# Patient Record
Sex: Male | Born: 1955 | Race: White | Marital: Single | Smoking: Never smoker
Health system: Southern US, Community
[De-identification: ages and names within clinical notes are randomized; demographics above are authoritative.]

---

## 2013-10-30 ENCOUNTER — Emergency Department (HOSPITAL_COMMUNITY)
Admission: EM | Admit: 2013-10-30 | Discharge: 2013-10-30 | Disposition: A | Discharge status: Home / Self Care | Payer: 59 | Attending: Emergency Medicine | Admitting: Emergency Medicine

## 2013-10-30 ENCOUNTER — Emergency Department (HOSPITAL_COMMUNITY): Payer: 59

## 2013-10-30 ENCOUNTER — Encounter (HOSPITAL_COMMUNITY): Payer: Self-pay | Admitting: Emergency Medicine

## 2013-10-30 DIAGNOSIS — J069 Acute upper respiratory infection, unspecified: Secondary | ICD-10-CM | POA: Insufficient documentation

## 2013-10-30 DIAGNOSIS — F411 Generalized anxiety disorder: Secondary | ICD-10-CM | POA: Insufficient documentation

## 2013-10-30 DIAGNOSIS — Z79899 Other long term (current) drug therapy: Secondary | ICD-10-CM | POA: Insufficient documentation

## 2013-10-30 DIAGNOSIS — F419 Anxiety disorder, unspecified: Secondary | ICD-10-CM

## 2013-10-30 LAB — BASIC METABOLIC PANEL
CO2: 20 mEq/L (ref 19–32)
GFR calc Af Amer: >90 mL/min (ref >90)
Glucose, Bld: 107 mg/dL — ABNORMAL HIGH (ref 70–99)
Potassium: 3.9 mEq/L (ref 3.7–5.3)
Sodium: 141 mEq/L (ref 137–147)

## 2013-10-30 LAB — I-STAT TROPONIN, ED: Troponin i, poc: 0 ng/mL (ref 0.00–0.08)

## 2013-10-30 LAB — PRO B NATRIURETIC PEPTIDE: Pro B Natriuretic peptide (BNP): 59.4 pg/mL (ref 0–125)

## 2013-10-30 LAB — BASIC METABOLIC PANEL WITH GFR
BUN: 16 mg/dL (ref 6–23)
Calcium: 9.3 mg/dL (ref 8.4–10.5)
Chloride: 105 meq/L (ref 96–112)
Creatinine, Ser: 0.99 mg/dL (ref 0.50–1.35)
GFR calc non Af Amer: >90 mL/min (ref >90)

## 2013-10-30 LAB — CBC
HCT: 43.7 % (ref 39.0–52.0)
Hemoglobin: 15.6 g/dL (ref 13.0–17.0)
MCH: 30.5 pg (ref 26.0–34.0)
MCHC: 35.7 g/dL (ref 30.0–36.0)
MCV: 85.4 fL (ref 78.0–100.0)
Platelets: 157 10*3/uL (ref 150–400)
RBC: 5.12 MIL/uL (ref 4.22–5.81)
RDW: 12.7 % (ref 11.5–15.5)
WBC: 9.6 K/uL (ref 4.0–10.5)

## 2013-10-30 LAB — CBG MONITORING, ED: Glucose-Capillary: 95 mg/dL (ref 70–99)

## 2013-10-30 MED ORDER — AZITHROMYCIN 250 MG PO TABS: 250.0000 mg | ORAL_TABLET | Freq: Every day | ORAL | Status: AC

## 2013-10-30 MED ORDER — ALBUTEROL SULFATE HFA 108 (90 BASE) MCG/ACT IN AERS
2.0000 | INHALATION_SPRAY | RESPIRATORY_TRACT | Status: DC | PRN
Start: 2013-10-30 — End: 2013-10-31

## 2013-10-30 MED ORDER — LORAZEPAM 2 MG/ML IJ SOLN
0.5000 mg | Freq: Once | INTRAMUSCULAR | Status: DC
  Filled 2013-10-30: qty 1

## 2013-10-30 MED ORDER — IPRATROPIUM BROMIDE 0.02 % IN SOLN
0.5000 mg | Freq: Once | RESPIRATORY_TRACT | Status: AC
  Administered 2013-10-30: 0.5 mg via RESPIRATORY_TRACT
  Filled 2013-10-30: qty 2.5

## 2013-10-30 MED ORDER — FENTANYL CITRATE 0.05 MG/ML IJ SOLN: 50.0000 ug | Freq: Once | INTRAMUSCULAR | Status: DC

## 2013-10-30 MED ORDER — LORAZEPAM 1 MG PO TABS: 0.5000 mg | ORAL_TABLET | Freq: Three times a day (TID) | ORAL | Status: AC | PRN

## 2013-10-30 MED ORDER — BENZONATATE 100 MG PO CAPS: 100.0000 mg | ORAL_CAPSULE | Freq: Three times a day (TID) | ORAL | Status: AC

## 2013-10-30 MED ORDER — LORAZEPAM 2 MG/ML IJ SOLN: 1.0000 mg | Freq: Once | INTRAMUSCULAR | Status: DC

## 2013-10-30 MED ORDER — IOHEXOL 350 MG/ML SOLN
100.0000 mL | Freq: Once | INTRAVENOUS | Status: AC | PRN
  Administered 2013-10-30: 100 mL via INTRAVENOUS

## 2013-10-30 MED ORDER — ALBUTEROL SULFATE (2.5 MG/3ML) 0.083% IN NEBU
5.0000 mg | INHALATION_SOLUTION | Freq: Once | RESPIRATORY_TRACT | Status: AC
  Administered 2013-10-30: 5 mg via RESPIRATORY_TRACT
  Filled 2013-10-30: qty 6

## 2013-10-30 NOTE — ED Notes (Signed)
Patient stated took to plane trips one day ago along with a 2 hour car ride.

## 2013-10-30 NOTE — ED Notes (Signed)
Pt reporting feeling sob after returning from CT. When asked if he wanted ativan for his anxiety pt refused, stating that " If I take ativan, then I have to stay here longer". Respirations 14, O2 sats 100% on 2L.

## 2013-10-30 NOTE — ED Provider Notes (Signed)
Medical screening examination/treatment/procedure(s) were performed by non-physician practitioner and as supervising physician I was immediately available for consultation/collaboration.   EKG Interpretation   Date/Time:  Friday October 30 2013 18:04:44 EDT Ventricular Rate:  54 PR Interval:  186 QRS Duration: 92 QT Interval:  426 QTC Calculation: 403 R Axis:   23 Text Interpretation:  Sinus bradycardia Otherwise normal ECG No previous  tracing Confirmed by [facility]  MD, MICHEAL ([id number]) on 10/30/2013 7:29:11 PM        Gavin PoundMichael Y. Oletta LamasGhim, MD 10/30/13 2207

## 2013-10-30 NOTE — Discharge Instructions (Signed)
Upper Respiratory Infection, Adult °An upper respiratory infection (URI) is also sometimes known as the common cold. The upper respiratory tract includes the nose, sinuses, throat, trachea, and bronchi. Bronchi are the airways leading to the lungs. Most people improve within 1 week, but symptoms can last up to 2 weeks. A residual cough may last even longer.  °CAUSES °Many different viruses can infect the tissues lining the upper respiratory tract. The tissues become irritated and inflamed and often become very moist. Mucus production is also common. A cold is contagious. You can easily spread the virus to others by oral contact. This includes kissing, sharing a glass, coughing, or sneezing. Touching your mouth or nose and then touching a surface, which is then touched by another person, can also spread the virus. °SYMPTOMS  °Symptoms typically develop 1 to 3 days after you come in contact with a cold virus. Symptoms vary from person to person. They may include: °· Runny nose. °· Sneezing. °· Nasal congestion. °· Sinus irritation. °· Sore throat. °· Loss of voice (laryngitis). °· Cough. °· Fatigue. °· Muscle aches. °· Loss of appetite. °· Headache. °· Low-grade fever. °DIAGNOSIS  °You might diagnose your own cold based on familiar symptoms, since most people get a cold 2 to 3 times a year. Your caregiver can confirm this based on your exam. Most importantly, your caregiver can check that your symptoms are not due to another disease such as strep throat, sinusitis, pneumonia, asthma, or epiglottitis. Blood tests, throat tests, and X-rays are not necessary to diagnose a common cold, but they may sometimes be helpful in excluding other more serious diseases. Your caregiver will decide if any further tests are required. °RISKS AND COMPLICATIONS  °You may be at risk for a more severe case of the common cold if you smoke cigarettes, have chronic heart disease (such as heart failure) or lung disease (such as asthma), or if  you have a weakened immune system. The very young and very old are also at risk for more serious infections. Bacterial sinusitis, middle ear infections, and bacterial pneumonia can complicate the common cold. The common cold can worsen asthma and chronic obstructive pulmonary disease (COPD). Sometimes, these complications can require emergency medical care and may be life-threatening. °PREVENTION  °The best way to protect against getting a cold is to practice good hygiene. Avoid oral or hand contact with people with cold symptoms. Wash your hands often if contact occurs. There is no clear evidence that vitamin C, vitamin E, echinacea, or exercise reduces the chance of developing a cold. However, it is always recommended to get plenty of rest and practice good nutrition. °TREATMENT  °Treatment is directed at relieving symptoms. There is no cure. Antibiotics are not effective, because the infection is caused by a virus, not by bacteria. Treatment may include: °· Increased fluid intake. Sports drinks offer valuable electrolytes, sugars, and fluids. °· Breathing heated mist or steam (vaporizer or shower). °· Eating chicken soup or other clear broths, and maintaining good nutrition. °· Getting plenty of rest. °· Using gargles or lozenges for comfort. °· Controlling fevers with ibuprofen or acetaminophen as directed by your caregiver. °· Increasing usage of your inhaler if you have asthma. °Zinc gel and zinc lozenges, taken in the first 24 hours of the common cold, can shorten the duration and lessen the severity of symptoms. Pain medicines may help with fever, muscle aches, and throat pain. A variety of non-prescription medicines are available to treat congestion and runny nose. Your caregiver   can make recommendations and may suggest nasal or lung inhalers for other symptoms.  °HOME CARE INSTRUCTIONS  °· Only take over-the-counter or prescription medicines for pain, discomfort, or fever as directed by your  caregiver. °· Use a warm mist humidifier or inhale steam from a shower to increase air moisture. This may keep secretions moist and make it easier to breathe. °· Drink enough water and fluids to keep your urine clear or pale yellow. °· Rest as needed. °· Return to work when your temperature has returned to normal or as your caregiver advises. You may need to stay home longer to avoid infecting others. You can also use a face mask and careful hand washing to prevent spread of the virus. °SEEK MEDICAL CARE IF:  °· After the first few days, you feel you are getting worse rather than better. °· You need your caregiver's advice about medicines to control symptoms. °· You develop chills, worsening shortness of breath, or brown or red sputum. These may be signs of pneumonia. °· You develop yellow or brown nasal discharge or pain in the face, especially when you bend forward. These may be signs of sinusitis. °· You develop a fever, swollen neck glands, pain with swallowing, or white areas in the back of your throat. These may be signs of strep throat. °SEEK IMMEDIATE MEDICAL CARE IF:  °· You have a fever. °· You develop severe or persistent headache, ear pain, sinus pain, or chest pain. °· You develop wheezing, a prolonged cough, cough up blood, or have a change in your usual mucus (if you have chronic lung disease). °· You develop sore muscles or a stiff neck. °Document Released: 01/16/2001 Document Revised: 10/15/2011 Document Reviewed: 11/24/2010 °[employer]® Patient Information ©2014 [employer]. °Panic Attacks °Panic attacks are sudden, short-lived surges of severe anxiety, fear, or discomfort. They may occur for no reason when you are relaxed, when you are anxious, or when you are sleeping. Panic attacks may occur for a number of reasons:  °· Healthy people occasionally have panic attacks in extreme, life-threatening situations, such as war or natural disasters. Normal anxiety is a protective mechanism of the body  that helps us react to danger (fight or flight response). °· Panic attacks are often seen with anxiety disorders, such as panic disorder, social anxiety disorder, generalized anxiety disorder, and phobias. Anxiety disorders cause excessive or uncontrollable anxiety. They may interfere with your relationships or other life activities. °· Panic attacks are sometimes seen with other mental illnesses such as depression and posttraumatic stress disorder. °· Certain medical conditions, prescription medicines, and drugs of abuse can cause panic attacks. °SYMPTOMS  °Panic attacks start suddenly, peak within 20 minutes, and are accompanied by four or more of the following symptoms: °· Pounding heart or fast heart rate (palpitations). °· Sweating. °· Trembling or shaking. °· Shortness of breath or feeling smothered. °· Feeling choked. °· Chest pain or discomfort. °· Nausea or strange feeling in your stomach. °· Dizziness, lightheadedness, or feeling like you will faint. °· Chills or hot flushes. °· Numbness or tingling in your lips or hands and feet. °· Feeling that things are not real or feeling that you are not yourself. °· Fear of losing control or going crazy. °· Fear of dying. °Some of these symptoms can mimic serious medical conditions. For example, you may think you are having a heart attack. Although panic attacks can be very scary, they are not life threatening. °DIAGNOSIS  °Panic attacks are diagnosed through an assessment by your health   care provider. Your health care provider will ask questions about your symptoms, such as where and when they occurred. Your health care provider will also ask about your medical history and use of alcohol and drugs, including prescription medicines. Your health care provider may order blood tests or other studies to rule out a serious medical condition. Your health care provider may refer you to a mental health professional for further evaluation. °TREATMENT  °· Most healthy people  who have one or two panic attacks in an extreme, life-threatening situation will not require treatment. °· The treatment for panic attacks associated with anxiety disorders or other mental illness typically involves counseling with a mental health professional, medicine, or a combination of both. Your health care provider will help determine what treatment is best for you. °· Panic attacks due to physical illness usually goes away with treatment of the illness. If prescription medicine is causing panic attacks, talk with your health care provider about stopping the medicine, decreasing the dose, or substituting another medicine. °· Panic attacks due to alcohol or drug abuse goes away with abstinence. Some adults need professional help in order to stop drinking or using drugs. °HOME CARE INSTRUCTIONS  °· Take all your medicines as prescribed.   °· Check with your health care provider before starting new prescription or over-the-counter medicines. °· Keep all follow up appointments with your health care provider. °SEEK MEDICAL CARE IF: °· You are not able to take your medicines as prescribed. °· Your symptoms do not improve or get worse. °SEEK IMMEDIATE MEDICAL CARE IF:  °· You experience panic attack symptoms that are different than your usual symptoms. °· You have serious thoughts about hurting yourself or others. °· You are taking medicine for panic attacks and have a serious side effect. °MAKE SURE YOU: °· Understand these instructions. °· Will watch your condition. °· Will get help right away if you are not doing well or get worse. °Document Released: 07/23/2005 Document Revised: 05/13/2013 Document Reviewed: 03/06/2013 °[employer]Care® Patient Information ©2014 [employer]. ° °

## 2013-10-30 NOTE — ED Notes (Signed)
Pt had a recent flight from [place]. Afterwards began to feel SOB and "like something is sitting on my chest, its not pain its just pressure." [facility] went to [facility] and they did [facility] ekg and cxr that were normal. They wanted him to come to ED for possible pe? A&ox4, breathing easily

## 2013-10-30 NOTE — ED Provider Notes (Signed)
CSN: [id number]     Arrival date & time 10/30/13  1756 History   First MD Initiated Contact with Patient 10/30/13 1847     Chief Complaint  Patient presents with  . Shortness of Breath     (Consider location/radiation/quality/duration/timing/severity/associated sxs/prior Treatment) HPI  Phillip Villanueva is a 58 y.o.male without any significant PMH presents to the ER with complaints of shortness of breath and chest pressure to his mid chest. [place] evaluation after being seen at [facility] for the same problems. He reports that all of the symptoms started after arriving here a few days ago. They checked an EKG and CXR there which were normal. They advised him to come to the ER for r/o PE. The patient is extremely anxious and a bit tearful. He reports being concerned that something very bad is going on. He also lets me know that he will not be staying in the ER because he needs to go to [place] tomorrow for work. Pt denies having coughing, hx of CP, PE, SOB, no lower extremity swelling, nausea, vomiting, diaphoresis.    History reviewed. No pertinent past medical history. History reviewed. No pertinent past surgical history. History reviewed. No pertinent family history. History  Substance Use Topics  . Smoking status: Never Smoker   . Smokeless tobacco: Not on file  . Alcohol Use: No    Review of Systems  The patient denies anorexia, fever, weight loss,, vision loss, decreased hearing, hoarseness, syncope,  balance deficits, hemoptysis, abdominal pain, melena, hematochezia, severe indigestion/heartburn, hematuria, incontinence, genital sores, muscle weakness, suspicious skin lesions, transient blindness, difficulty walking, depression, unusual weight change, abnormal bleeding, enlarged lymph nodes, angioedema, and breast masses.   Allergies  Corn syrup  Home Medications   Current Outpatient Rx  Name  Route  Sig  Dispense  Refill  . atenolol (TENORMIN) 50 MG  tablet   Oral   Take 100 mg by mouth at bedtime.         . diphenhydrAMINE (BENADRYL) 25 MG tablet   Oral   Take 25 mg by mouth every 6 (six) hours as needed for allergies or sleep.         Marland Kitchen topiramate (TOPAMAX) 50 MG tablet   Oral   Take 50 mg by mouth at bedtime.         Marland Kitchen azithromycin (ZITHROMAX) 250 MG tablet   Oral   Take 1 tablet (250 mg total) by mouth daily. Take first 2 tablets together, then 1 every day until finished.   6 tablet   0   . benzonatate (TESSALON) 100 MG capsule   Oral   Take 1 capsule (100 mg total) by mouth every 8 (eight) hours.   21 capsule   0   . LORazepam (ATIVAN) 1 MG tablet   Oral   Take 0.5-1 tablets (0.5-1 mg total) by mouth 3 (three) times daily as needed for anxiety.   15 tablet   0    BP 120/83  Pulse 52  Temp(Src) 98.6 F (37 C) (Oral)  Resp 14  SpO2 100% Physical Exam  Nursing note and vitals reviewed. Constitutional: He appears well-developed and well-nourished. He appears distressed (tearul).  HENT:  Head: Normocephalic and atraumatic.  Eyes: Pupils are equal, round, and reactive to light.  Neck: Normal range of motion. Neck supple.  Cardiovascular: Normal rate and regular rhythm.   Pulmonary/Chest: Effort normal. No respiratory distress. He has no wheezes. He has no rales.  Abdominal:  Soft.  Neurological: He is alert.  Skin: Skin is warm and dry.  Psychiatric: His mood appears anxious. His speech is rapid and/or pressured.    ED Course  Procedures (including critical care time) Labs Review Labs Reviewed  BASIC METABOLIC PANEL - Abnormal; Notable for the following:    Glucose, Bld 107 (*)    All other components within normal limits  CBC  PRO B NATRIURETIC PEPTIDE  I-STAT TROPOININ, ED  CBG MONITORING, ED  CBG MONITORING, ED   Imaging Review Ct Angio Chest Pe W/cm &/or Wo Cm  10/30/2013   CLINICAL DATA:  Mid chest pain. Shortness of breath. Recent air travel. Chest pressure.  EXAM: CT ANGIOGRAPHY CHEST  WITH CONTRAST  TECHNIQUE: Multidetector CT imaging of the chest was performed using the standard protocol during bolus administration of intravenous contrast. Multiplanar CT image reconstructions and MIPs were obtained to evaluate the vascular anatomy.  CONTRAST:  100mL OMNIPAQUE IOHEXOL 350 MG/ML SOLN  COMPARISON:  No comparisons  FINDINGS: No filling defect is identified in the pulmonary arterial tree to suggest pulmonary embolus. No acute aortic findings, although lack of systemic arterial contrast reduces sensitivity in assessing the aorta for a number of conditions.  No pathologic thoracic adenopathy. No flattening of the interventricular septum. Asymmetric thyroid lobes with the left lobe extending into the upper mediastinum, favoring thyroid goiter.  No acute bony findings. Minimal dependent subsegmental atelectasis in the lungs.  Review of the MIP images confirms the above findings.  IMPRESSION: 1. No filling defect is identified in the pulmonary arterial tree to suggest pulmonary embolus. No acute thoracic findings are identified. 2. Left thyroid lobe extends into the upper mediastinum, favoring thyroid goiter. If clinically warranted, this could be worked up with thyroid ultrasound in the non emergent setting.   Electronically Signed   By: Herbie BaltimoreWalt  Liebkemann M.D.   On: 10/30/2013 20:18     EKG Interpretation   Date/Time:  Friday October 30 2013 18:04:44 EDT Ventricular Rate:  54 PR Interval:  186 QRS Duration: 92 QT Interval:  426 QTC Calculation: 403 R Axis:   23 Text Interpretation:  Sinus bradycardia Otherwise normal ECG No previous  tracing Confirmed by [facility]  MD, MICHEAL ([id number]) on 10/30/2013 7:29:11 PM      MDM   Final diagnoses:  Anxiety  URI (upper respiratory infection)    Patient is very anxious but refuses the Ativan. His EKG, Troponin, and labs are all very reassuring CT angio of the chest pending to r/o PE or any other concerning pathology of his SOB aside from  anxiety.  9:30 pm- the patients CT angio has come back negative for findings that suggest his symptoms. His daughter came out of the room and let me know that his wife is very sick and that his mother is dying. She says he has been having increasingly worsening panic attacks. She asks if I can discharge him with some Ativan. Will Rx a small dose as well as abx for the cough. Pt did have some mild improvement with breathing treatment and felt as though he could breath better with it.  57 y.o.Phillip Villanueva's evaluation in the Emergency Department is complete. It has been determined that no acute conditions requiring further emergency intervention are present at this time. The patient/guardian have been advised of the diagnosis and plan. We have discussed signs and symptoms that warrant return to the ED, such as changes or worsening in symptoms.  Vital signs are stable at discharge. Filed Vitals:  10/30/13 2017  BP: 120/83  Pulse: 52  Temp:   Resp: 14    Patient/guardian has voiced understanding and agreed to follow-up with the PCP or specialist.    Dorthula Matas, PA-C 10/30/13 2151

## 2013-10-30 NOTE — ED Notes (Signed)
Discharge instructions reviewed with pt. Pt verbalized understanding.   

## 2015-05-21 IMAGING — CT CT ANGIO CHEST
2 of 8 series · 18 of 46 positions shown · IV contrast (APPLIED)
Comparison: No comparisons

CLINICAL DATA: Mid chest pain. Shortness of breath. Recent air
travel. Chest pressure.

EXAM:
CT ANGIOGRAPHY CHEST WITH CONTRAST
TECHNIQUE: Multidetector CT imaging of the chest was performed using the
standard protocol during bolus administration of intravenous
contrast. Multiplanar CT image reconstructions and MIPs were
obtained to evaluate the vascular anatomy.
CONTRAST:  100mL OMNIPAQUE IOHEXOL 350 MG/ML SOLN

[Series 5: thins · axial · 0.76mm/px · z∈[+1221,+1460]mm · 15 of 263 slices shown]
[im 12/263  lung]
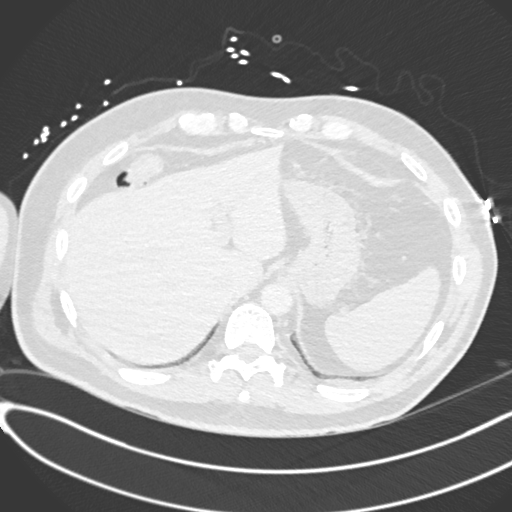
[im 36/263  soft-tissue]
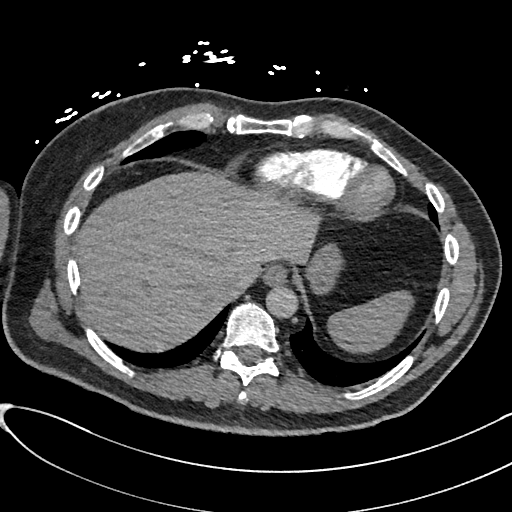
[im 48/263  lung]
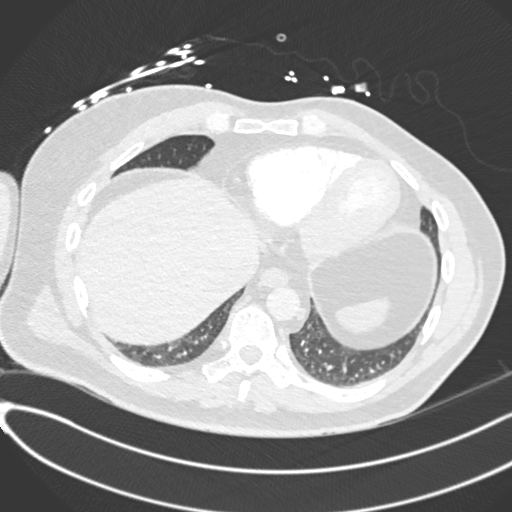
[im 60/263  soft-tissue]
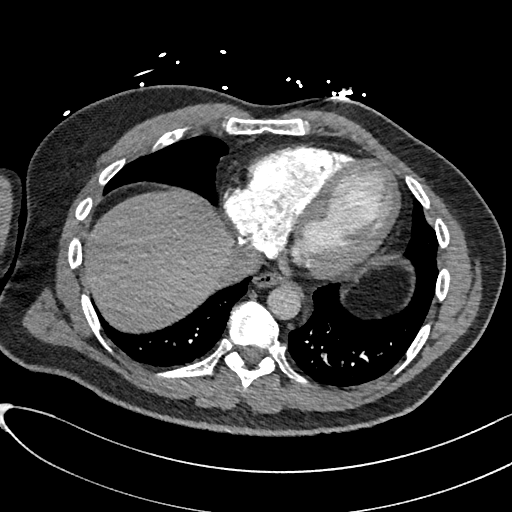
[im 84/263  lung]
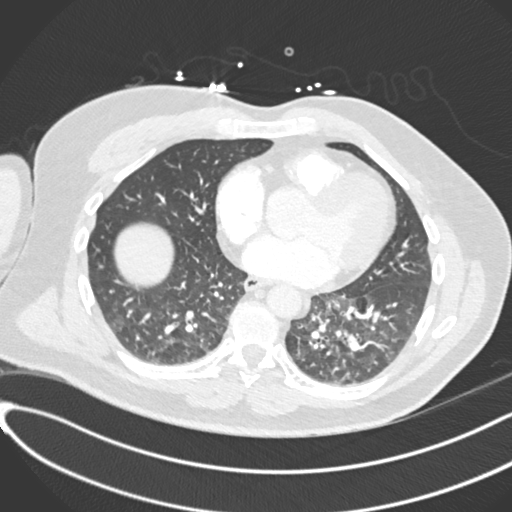
[im 96/263  soft-tissue]
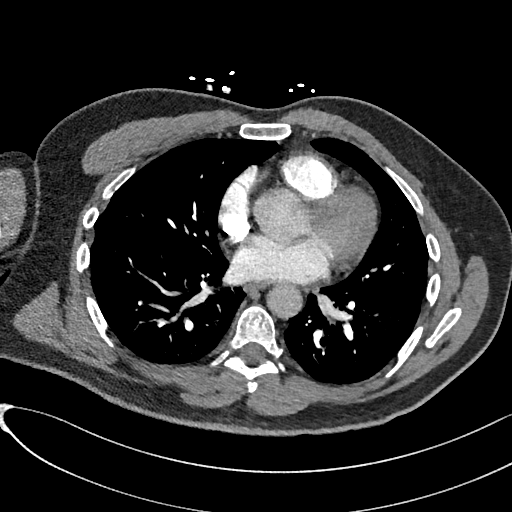
[im 120/263  lung]
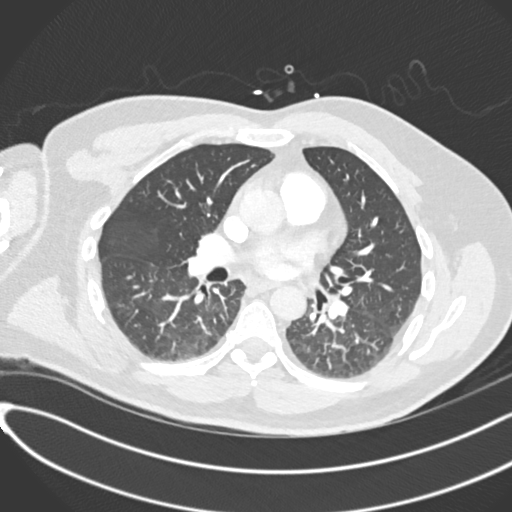
[im 132/263  soft-tissue]
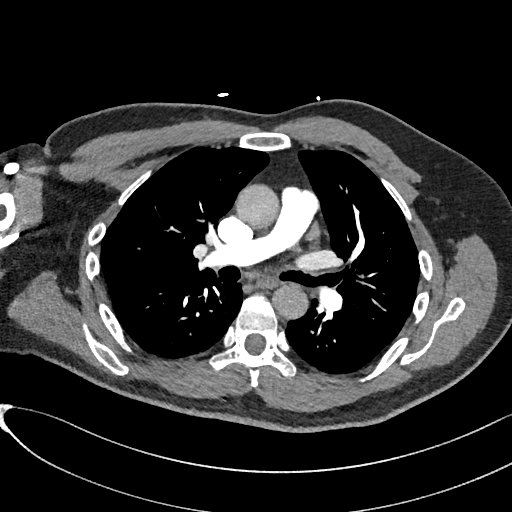
[im 143/263  lung]
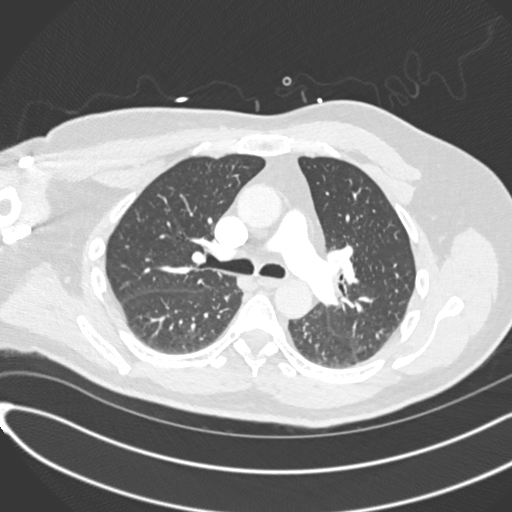
[im 167/263  soft-tissue]
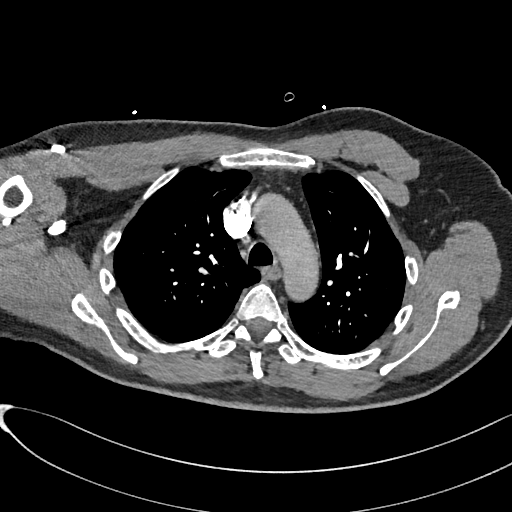
[im 179/263  lung]
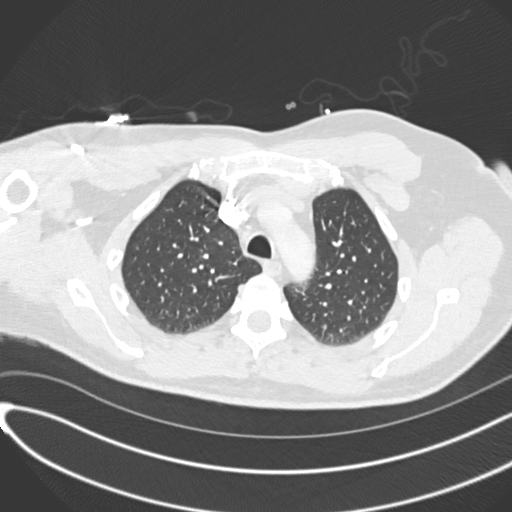
[im 203/263  soft-tissue]
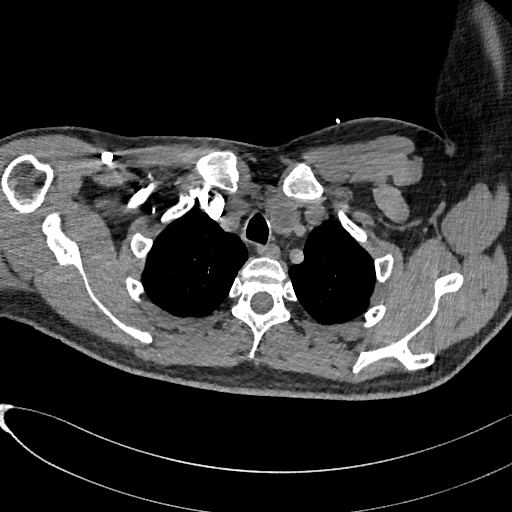
[im 215/263  lung]
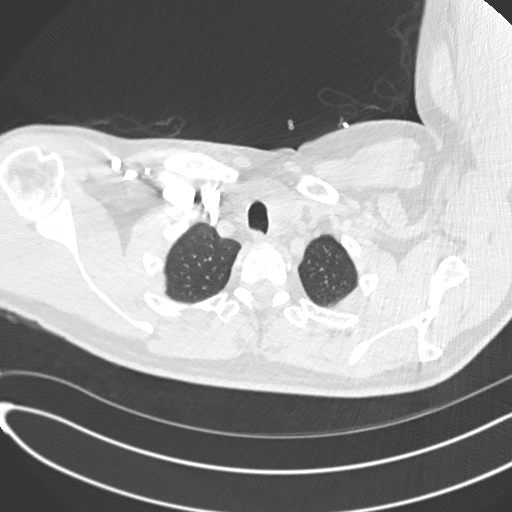
[im 227/263  soft-tissue]
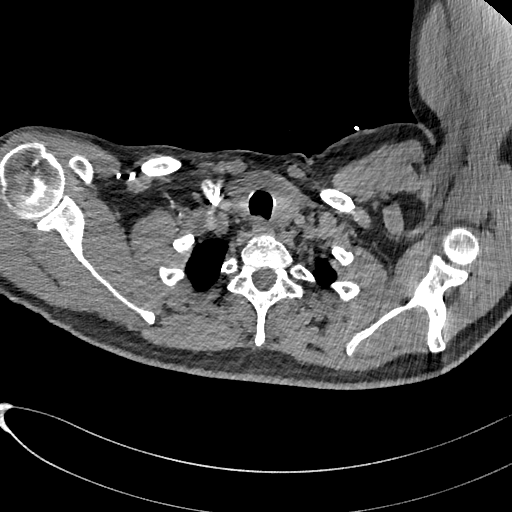
[im 251/263  lung]
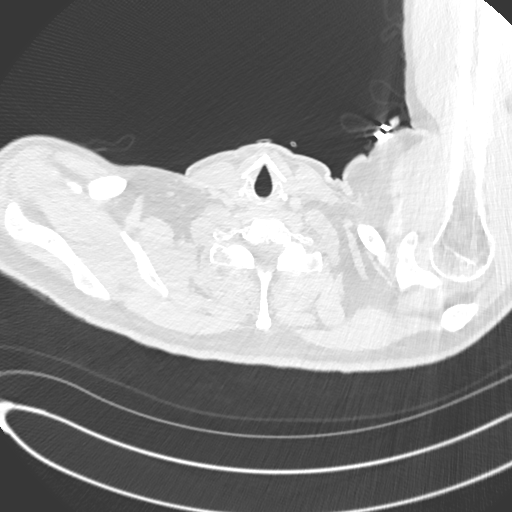

[Series 7: coronal mpr · coronal · 0.59mm/px · 3 of 123 slices shown]
[im 31/123  soft-tissue]
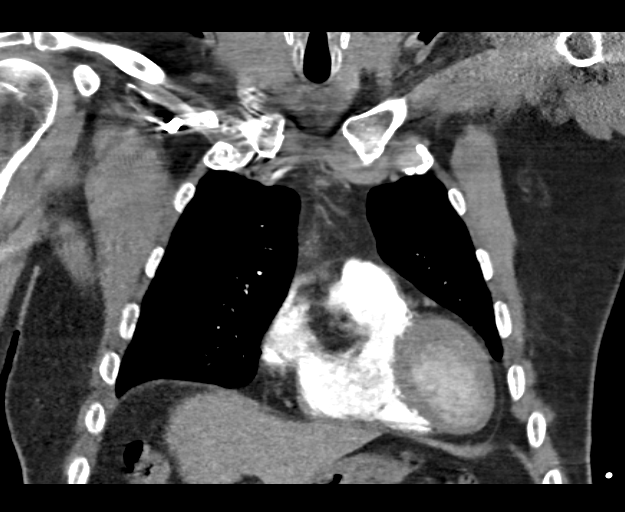
[im 62/123  soft-tissue]
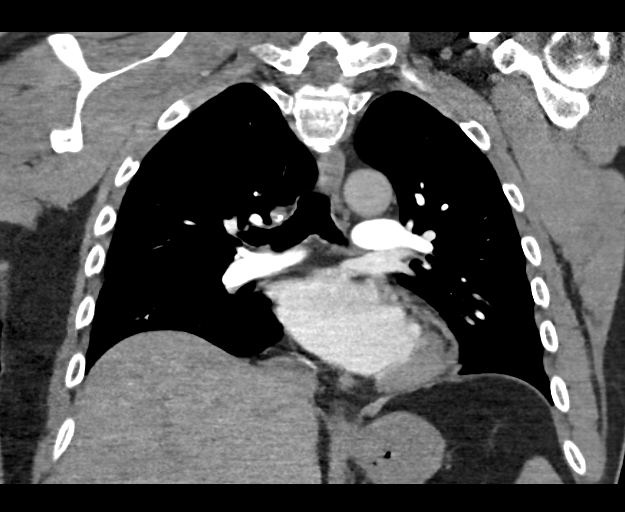
[im 92/123  soft-tissue]
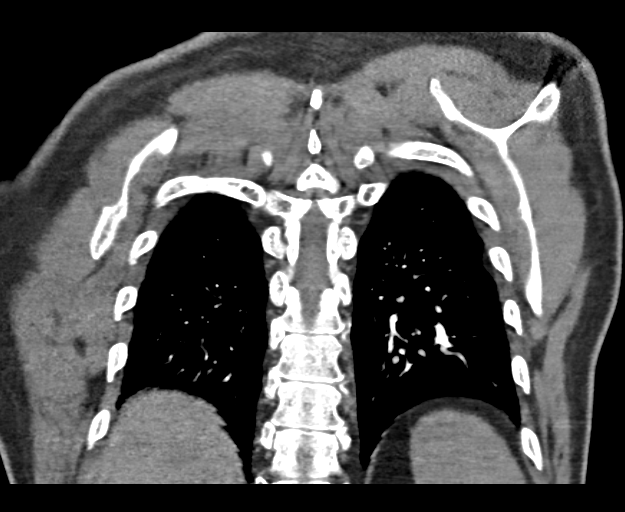

[18 of 46 positions shown; findings below may reference images not displayed]

FINDINGS: No filling defect is identified in the pulmonary arterial tree to
suggest pulmonary embolus. No acute aortic findings, although lack
of systemic arterial contrast reduces sensitivity in assessing the
aorta for a number of conditions.

No pathologic thoracic adenopathy. No flattening of the
interventricular septum. Asymmetric thyroid lobes with the left lobe
extending into the upper mediastinum, favoring thyroid goiter.

No acute bony findings. Minimal dependent subsegmental atelectasis
in the lungs.

Review of the MIP images confirms the above findings.
IMPRESSION: 1. No filling defect is identified in the pulmonary arterial tree to
suggest pulmonary embolus. No acute thoracic findings are
identified.
2. Left thyroid lobe extends into the upper mediastinum, favoring
thyroid goiter. If clinically warranted, this could be worked up
with thyroid ultrasound in the non emergent setting.
